# Patient Record
Sex: Male | Born: 1970 | Race: White | Hispanic: No | Marital: Married | Smoking: Never smoker
Health system: Southern US, Community
[De-identification: ages and names within clinical notes are randomized; demographics above are authoritative.]

---

## 2008-09-07 ENCOUNTER — Encounter: Admission: RE | Admit: 2008-09-07 | Discharge: 2008-09-07 | Payer: Self-pay | Admitting: Family Medicine

## 2017-10-08 ENCOUNTER — Encounter (INDEPENDENT_AMBULATORY_CARE_PROVIDER_SITE_OTHER): Payer: Self-pay | Admitting: Orthopaedic Surgery

## 2017-10-08 ENCOUNTER — Ambulatory Visit (INDEPENDENT_AMBULATORY_CARE_PROVIDER_SITE_OTHER): Payer: BC Managed Care – PPO

## 2017-10-08 ENCOUNTER — Ambulatory Visit (INDEPENDENT_AMBULATORY_CARE_PROVIDER_SITE_OTHER): Payer: BC Managed Care – PPO | Admitting: Orthopaedic Surgery

## 2017-10-08 VITALS — Ht 72.0 in | Wt 165.0 lb

## 2017-10-08 DIAGNOSIS — M25551 Pain in right hip: Secondary | ICD-10-CM

## 2017-10-08 NOTE — Progress Notes (Signed)
Office Visit Note   Patient: Kenneth Donovan           Date of Birth: [birth year]           MRN: [id number] Visit Date: 10/08/2017              Requested by: No referring provider defined for this encounter. PCP: Joycelyn Rua, MD   Assessment & Plan: Visit Diagnoses:  1. Pain in right hip     Plan: He is already tried activity modification and rest with taking 2-3 weeks off at a time from high impact aerobic activities with still becoming symptomatic when he gets back to high impact aerobic activities.  At this point given his normal plain film findings of like to set him up for an intra-articular steroid injection under live fluoroscopy by Dr. Alvester Morin.  This will be for his right hip.  The patient definitely wants to try this route.  All questions concerns were answered and addressed.  I will see him back in 4 weeks from now and he will already have had his injection we can see how this is helping him.  Of also recommended him trying glucosamine.  Follow-Up Instructions: Return in about 1 month (around 11/05/2017).   Orders:  Orders Placed This Encounter  Procedures  . XR HIP UNILAT W OR W/O PELVIS 2-3 VIEWS RIGHT   No orders of the defined types were placed in this encounter.     Procedures: No procedures performed   Clinical Data: No additional findings.   Subjective: Chief Complaint  Patient presents with  . Right Hip - Pain  Patient is a very pleasant 47 year old gentleman who comes in with chief complaint of right hip pain.  It hurts mainly in his groin and hurts mainly with high impact aerobic activities.  He is very athletic individual and also very thin.  He says if he is been on a run or perform rather high impact activities he then hurts in the groin area afterwards.  He also hurts on the extremes of rotation of his hip.  He denies any lateral hip pain.  He denies any pain with his left hip.  He denies any low back pain.  He denies any numbness and tingling in his  legs or feet.  This is been going on for a few months now.  It is slowly progressed and occasionally he gets a "catching sensation" in his hip.  He denies any specific injury that he is aware of.  He denies any urologic symptoms.  HPI  Review of Systems He currently denies any headache, chest pain, shortness of breath, fever, chills, nausea, vomiting.  Objective: Vital Signs: Ht 6' (1.829 m)   Wt 165 lb (74.8 kg)   BMI 22.38 kg/m   Physical Exam He is alert and oriented x3 and in no acute distress Ortho Exam Examination of his left hip is normal.  Examination of his right hip shows fluid and full range of motion but with pain at the extremes of rotation of the right hip. Specialty Comments:  No specialty comments available.  Imaging: Xr Hip Unilat W Or W/o Pelvis 2-3 Views Right  Result Date: 10/08/2017 An AP pelvis and lateral of the right hip show normal-appearing right hip with normal-appearing joint space in no acute findings.    PMFS History: There are no active problems to display for this patient.  History reviewed. No pertinent past medical history.  History reviewed. No pertinent family history.  History  reviewed. No pertinent surgical history. Social History   Occupational History  . Not on file  Tobacco Use  . Smoking status: Never Smoker  . Smokeless tobacco: Never Used  Substance and Sexual Activity  . Alcohol use: Yes    Comment: 5-7 drinks a week  . Drug use: Never  . Sexual activity: Not on file

## 2017-10-09 ENCOUNTER — Other Ambulatory Visit (INDEPENDENT_AMBULATORY_CARE_PROVIDER_SITE_OTHER): Payer: Self-pay

## 2017-10-09 DIAGNOSIS — M25551 Pain in right hip: Secondary | ICD-10-CM

## 2017-10-12 ENCOUNTER — Ambulatory Visit (INDEPENDENT_AMBULATORY_CARE_PROVIDER_SITE_OTHER): Payer: BC Managed Care – PPO | Admitting: Physical Medicine and Rehabilitation

## 2017-10-12 ENCOUNTER — Ambulatory Visit (INDEPENDENT_AMBULATORY_CARE_PROVIDER_SITE_OTHER): Payer: BC Managed Care – PPO

## 2017-10-12 ENCOUNTER — Encounter (INDEPENDENT_AMBULATORY_CARE_PROVIDER_SITE_OTHER): Payer: Self-pay | Admitting: Physical Medicine and Rehabilitation

## 2017-10-12 DIAGNOSIS — M25551 Pain in right hip: Secondary | ICD-10-CM

## 2017-10-12 NOTE — Progress Notes (Signed)
.  Numeric Pain Rating Scale and Functional Assessment Average Pain 4   In the last MONTH (on 0-10 scale) has pain interfered with the following?  1. General activity like being  able to carry out your everyday physical activities such as walking, climbing stairs, carrying groceries, or moving a chair?  Rating(1)   +Driver, -BT, -Dye Allergies.  

## 2017-10-12 NOTE — Patient Instructions (Signed)

## 2017-10-12 NOTE — Progress Notes (Signed)
   Kenneth FerrariRandy Donovan - 47 y.o. male MRN [id number]  Date of birth: 10/31/1970  Office Visit Note: Visit Date: 10/12/2017 PCP: Kenneth Donovan Referred by: Kenneth Donovan  Subjective: Chief Complaint  Patient presents with  . 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  He comes in today at the request of Dr. Magnus Donovan for intra-articular anesthetic hip arthrogram on the right.  He reports pain in the right hip and groin that started about 6 months ago.  He does not report much in the way of pain until after prolonged activity and then it will flareup.  He does have some sense of the pain most of the time.  His average pain is a 4 out of 10 but it can progress after activity.  Is not really limiting what he can do daily.   ROS Otherwise per HPI.  Assessment & Plan: Visit Diagnoses:  1. Pain in right hip     Plan: No additional findings.   Meds & Orders: No orders of the defined types were placed in this encounter.   Orders Placed This Encounter  Procedures  . Large Joint Inj: R hip joint  . XR C-ARM NO REPORT    Follow-up: Return for Dr. Magnus Donovan.   Procedures: Large Joint Inj: R hip joint on 10/12/2017 8:50 AM Indications: pain and diagnostic evaluation Details: 22 G needle, anterior approach  Arthrogram: Yes  Medications: 3 mL bupivacaine 0.5 %; 80 mg triamcinolone acetonide 40 MG/ML Outcome: tolerated well, no immediate complications  Arthrogram demonstrated excellent flow of contrast throughout the joint surface without extravasation or obvious defect.  The patient had some relief of symptoms during the anesthetic phase of the injection but will likely need to be active and see how the cortisone part of the injection seems to help.  He will follow-up with Dr. Magnus Donovan.  Procedure, treatment alternatives, risks and benefits explained, specific risks discussed. Consent was  given by the patient. Immediately prior to procedure a time out was called to verify the correct patient, procedure, equipment, support staff and site/side marked as required. Patient was prepped and draped in the usual sterile fashion.      No notes on file   Clinical History: No specialty comments available.   He reports that he has never smoked. He has never used smokeless tobacco. No results for input(s): HGBA1C, LABURIC in the last 8760 hours.  Objective:  VS:  HT:    WT:   BMI:     BP:   HR: bpm  TEMP: ( )  RESP:  Physical Exam  Ortho Exam Imaging: No results found.  Past Medical/Family/Surgical/Social History: Medications & Allergies reviewed per EMR, new medications updated. There are no active problems to display for this patient.  History reviewed. No pertinent past medical history. History reviewed. No pertinent family history. History reviewed. No pertinent surgical history. Social History   Occupational History  . Not on file  Tobacco Use  . Smoking status: Never Smoker  . Smokeless tobacco: Never Used  Substance and Sexual Activity  . Alcohol use: Yes    Comment: 5-7 drinks a week  . Drug use: Never  . Sexual activity: Not on file

## 2017-10-19 MED ORDER — TRIAMCINOLONE ACETONIDE 40 MG/ML IJ SUSP
80.0000 mg | INTRAMUSCULAR | Status: AC | PRN
  Administered 2017-10-12: 80 mg via INTRA_ARTICULAR

## 2017-10-19 MED ORDER — BUPIVACAINE HCL 0.5 % IJ SOLN
3.0000 mL | INTRAMUSCULAR | Status: AC | PRN
  Administered 2017-10-12: 3 mL via INTRA_ARTICULAR

## 2017-11-07 ENCOUNTER — Ambulatory Visit (INDEPENDENT_AMBULATORY_CARE_PROVIDER_SITE_OTHER): Payer: BC Managed Care – PPO | Admitting: Orthopaedic Surgery

## 2017-11-07 ENCOUNTER — Encounter (INDEPENDENT_AMBULATORY_CARE_PROVIDER_SITE_OTHER): Payer: Self-pay | Admitting: Orthopaedic Surgery

## 2017-11-07 DIAGNOSIS — M25551 Pain in right hip: Secondary | ICD-10-CM | POA: Diagnosis not present

## 2017-11-07 NOTE — Progress Notes (Signed)
The patient is following up for his right hip.  He is an avid athlete and exerciser and still getting locking catching in his right hip in the groin area.  He has pain on extremes of rotation as well.  We had obtained plain films of his pelvis and right hip at the last visit and they were normal-appearing in terms of anatomy and joint space.  I sent in for an intra-articular steroid injection and he said for 5 days he was pain-free.  He is gotten back to where now he is having pain in the groin again with locking and catching.  On exam his range of motion is entirely full of his right hip there is definitely pain on extremes of rotation.  At this point my concern is that he has a labral tear.  Given the symptoms that he is describing with a stabbing sharp feeling in his groin area and pain with extremes of rotation, there appears to be mechanical cause to his pain.  With that being said at this point an MRI arthrogram of the right hip is warranted to rule out a labral tear and to assess the cartilage in his hip.  All questions and concerns were answered and addressed.  We will work on getting that scheduled and see him back in follow-up to go over the MRI findings.

## 2017-11-08 ENCOUNTER — Other Ambulatory Visit (INDEPENDENT_AMBULATORY_CARE_PROVIDER_SITE_OTHER): Payer: Self-pay

## 2017-11-08 DIAGNOSIS — M25551 Pain in right hip: Secondary | ICD-10-CM

## 2017-11-21 ENCOUNTER — Ambulatory Visit (INDEPENDENT_AMBULATORY_CARE_PROVIDER_SITE_OTHER): Payer: BC Managed Care – PPO | Admitting: Orthopaedic Surgery

## 2017-11-26 ENCOUNTER — Ambulatory Visit
Admission: RE | Admit: 2017-11-26 | Discharge: 2017-11-26 | Disposition: A | Discharge status: Home / Self Care | Payer: BC Managed Care – PPO | Source: Ambulatory Visit | Attending: Orthopaedic Surgery | Admitting: Orthopaedic Surgery

## 2017-11-26 DIAGNOSIS — M25551 Pain in right hip: Secondary | ICD-10-CM

## 2017-11-26 MED ORDER — IOPAMIDOL (ISOVUE-M 200) INJECTION 41%
15.0000 mL | Freq: Once | INTRAMUSCULAR | Status: AC
  Administered 2017-11-26: 15 mL via INTRA_ARTICULAR

## 2017-11-28 ENCOUNTER — Ambulatory Visit (INDEPENDENT_AMBULATORY_CARE_PROVIDER_SITE_OTHER): Payer: BC Managed Care – PPO | Admitting: Orthopaedic Surgery

## 2017-11-28 ENCOUNTER — Encounter (INDEPENDENT_AMBULATORY_CARE_PROVIDER_SITE_OTHER): Payer: Self-pay | Admitting: Orthopaedic Surgery

## 2017-11-28 DIAGNOSIS — M25551 Pain in right hip: Secondary | ICD-10-CM

## 2017-11-28 MED ORDER — METHYLPREDNISOLONE 4 MG PO TABS: ORAL_TABLET | ORAL | 0 refills | Status: AC

## 2017-11-28 MED ORDER — DICLOFENAC SODIUM 1 % TD GEL: 2.0000 g | Freq: Four times a day (QID) | TRANSDERMAL | 3 refills | Status: AC

## 2017-11-28 NOTE — Progress Notes (Signed)
The patient is coming for follow-up to go over an MRI of his right hip.  He is a 47 year old avid runner and been having problems with pain around his right hip and we felt that he likely had a labral tear based on his clinical exam findings.  He still has pain that seems to be worsening around the right hip.  Pubis area.  The MRI did not show any cartilage changes in the hip at all and his labrum was normal.  It did show periosteal reaction and edema around the right pubis area suggesting some type of strain.  At this point we will try a steroid taper and send outpatient physical therapy as well as continued activity modification.  Over this will calm things down but if it does not he knows he can call us and we will pursue other treatment modalities or even a dedicated MRI of the pelvis and pubis area.  All questions concerns were answered and addressed.

## 2018-02-27 ENCOUNTER — Other Ambulatory Visit: Payer: Self-pay | Admitting: Surgery

## 2018-03-14 ENCOUNTER — Other Ambulatory Visit: Payer: Self-pay | Admitting: Surgery

## 2018-03-14 DIAGNOSIS — R1031 Right lower quadrant pain: Secondary | ICD-10-CM

## 2020-04-12 ENCOUNTER — Emergency Department (HOSPITAL_COMMUNITY)
Admission: EM | Admit: 2020-04-12 | Discharge: 2020-04-13 | Disposition: A | Discharge status: Home / Self Care | Payer: BC Managed Care – PPO | Attending: Emergency Medicine | Admitting: Emergency Medicine

## 2020-04-12 ENCOUNTER — Other Ambulatory Visit: Payer: Self-pay

## 2020-04-12 ENCOUNTER — Encounter (HOSPITAL_COMMUNITY): Payer: Self-pay

## 2020-04-12 ENCOUNTER — Emergency Department (HOSPITAL_COMMUNITY): Payer: BC Managed Care – PPO

## 2020-04-12 DIAGNOSIS — T63091A Toxic effect of venom of other snake, accidental (unintentional), initial encounter: Secondary | ICD-10-CM | POA: Insufficient documentation

## 2020-04-12 DIAGNOSIS — W5911XA Bitten by nonvenomous snake, initial encounter: Secondary | ICD-10-CM

## 2020-04-12 DIAGNOSIS — Z23 Encounter for immunization: Secondary | ICD-10-CM | POA: Insufficient documentation

## 2020-04-12 MED ORDER — TETANUS-DIPHTH-ACELL PERTUSSIS 5-2.5-18.5 LF-MCG/0.5 IM SUSP
0.5000 mL | Freq: Once | INTRAMUSCULAR | Status: AC
  Administered 2020-04-12: 0.5 mL via INTRAMUSCULAR
  Filled 2020-04-12: qty 0.5

## 2020-04-12 NOTE — ED Notes (Signed)
Foot - 26cm Ankle - 22cm Calf - 37cm  Thigh - 46cm  No groin tenderness, no change in appearance since presentation.

## 2020-04-12 NOTE — ED Provider Notes (Signed)
[facility] EMERGENCY DEPARTMENT Provider Note  CSN: [id number] Arrival date & time: 04/12/20 1936    History Chief Complaint  Patient presents with  . Snake Bite    HPI  Kenneth Donovan is a 49 y.o. male who reports he was walking his dog earlier this evening when he was bitten by a snake, reported to be a copperhead, on the R heel. Reports moderate aching pain. No significant swelling as of my evaluation. Bite occurred around 1900hrs    History reviewed. No pertinent past medical history.  History reviewed. No pertinent surgical history.  No family history on file.  Social History   Tobacco Use  . Smoking status: Never Smoker  . Smokeless tobacco: Never Used  Substance Use Topics  . Alcohol use: Yes    Comment: 5-7 drinks a week  . Drug use: Never     Home Medications Prior to Admission medications   Medication Sig Start Date End Date Taking? Authorizing Provider  diclofenac sodium (VOLTAREN) 1 % GEL Apply 2 g topically 4 (four) times daily. 11/28/17   Kathryne Hitch, MD  fluticasone (FLONASE) 50 MCG/ACT nasal spray Place into both nostrils daily.    [provider]  loratadine-pseudoephedrine (CLARITIN-D 12-HOUR) 5-120 MG tablet Take 1 tablet by mouth 2 (two) times daily.    [provider]  methylPREDNISolone (MEDROL) 4 MG tablet Medrol dose pack. Take as instructed 11/28/17   Kathryne Hitch, MD     Allergies    Patient has no known allergies.   Review of Systems   Review of Systems A comprehensive review of systems was completed and negative except as noted in HPI.    Physical Exam BP (!) 184/74 (BP Location: Left Arm)   Pulse 80   Temp 98.3 F (36.8 C) (Oral)   Resp 16   Ht 6' (1.829 m)   Wt 64.4 kg   SpO2 99%   BMI 19.26 kg/m   Physical Exam Vitals and nursing note reviewed.  HENT:     Head: Normocephalic.     Nose: Nose normal.  Eyes:     Extraocular Movements: Extraocular movements intact.    Pulmonary:     Effort: Pulmonary effort is normal.  Musculoskeletal:        General: Normal range of motion.     Cervical back: Neck supple.     Comments: Small bite mark to R lateral heel, mild-moderate tenderness, no significant soft tissue swelling proximal to bite.   Skin:    Findings: No rash (on exposed skin).  Neurological:     Mental Status: He is alert and oriented to person, place, and time.  Psychiatric:        Mood and Affect: Mood normal.      ED Results / Procedures / Treatments   Labs (all labs ordered are listed, but only abnormal results are displayed) Labs Reviewed - No data to display  EKG None  Radiology DG Foot Complete Right  Result Date: 04/12/2020 CLINICAL DATA:  Snake bite EXAM: RIGHT FOOT COMPLETE - 3+ VIEW COMPARISON:  None. FINDINGS: There is no evidence of fracture or dislocation. There is no evidence of arthropathy or other focal bone abnormality. Soft tissues are unremarkable. IMPRESSION: Negative. Electronically Signed   By: Jasmine Pang M.D.   On: 04/12/2020 21:17    Procedures Procedures  Medications Ordered in the ED Medications  Tdap (BOOSTRIX) injection 0.5 mL (0.5 mLs Intramuscular Given 04/12/20 2256)     MDM Rules/Calculators/A&P  MDM Will follow poison control recommendations for observation and periodic measurements of swelling. Check xray for FB and update TDAP  ED Course  I have reviewed the triage vital signs and the nursing notes.  Pertinent labs & imaging results that were available during my care of the patient were reviewed by me and considered in my medical decision making (see chart for details).  Clinical Course as of Apr 12 2314  Mon Apr 12, 2020  2311 Patient remains well appearing. No further swelling, now 5 hours post bite. He has some R groin discomfort but no swelling or bruising. Poison control does not recommend Cofab. Poison control typically recommends an 8 hour observation period, but with no change at  this point he would like to go home. Advised to return for any worsening pain, swelling bruising, SOB, GI symptoms, fever or any other concerns.    [CS]    Clinical Course User Index [CS] Pollyann Savoy, MD    Final Clinical Impression(s) / ED Diagnoses Final diagnoses:  Snake bite, initial encounter    Rx / DC Orders ED Discharge Orders    None       Pollyann Savoy, MD 04/12/20 2315

## 2020-04-12 NOTE — ED Triage Notes (Addendum)
Pt reports being bit by a medium sized copperhead at approx 1900 on right heel. No obvious swelling or bruising. Denies, ice, or tourniquet.   Poison control contacted. Recommend observation for 8 hours. They state only coagulations if swelling develops. Spoke with Revonda Standard, RN/

## 2020-04-12 NOTE — ED Notes (Signed)
Poison control contacted confirms that groin tenderness is normal. Spoke with Verlon Au, Charity fundraiser.

## 2020-04-12 NOTE — ED Notes (Signed)
Foot- 26cm  Ankle- 22cm  Calf- 36cm Thigh- 45cm   States new groin tenderness

## 2021-03-26 ENCOUNTER — Emergency Department (HOSPITAL_BASED_OUTPATIENT_CLINIC_OR_DEPARTMENT_OTHER)
Admission: EM | Admit: 2021-03-26 | Discharge: 2021-03-26 | Disposition: A | Discharge status: Home / Self Care | Payer: BC Managed Care – PPO | Attending: Emergency Medicine | Admitting: Emergency Medicine

## 2021-03-26 ENCOUNTER — Emergency Department (HOSPITAL_BASED_OUTPATIENT_CLINIC_OR_DEPARTMENT_OTHER): Payer: BC Managed Care – PPO | Admitting: Radiology

## 2021-03-26 ENCOUNTER — Encounter (HOSPITAL_BASED_OUTPATIENT_CLINIC_OR_DEPARTMENT_OTHER): Payer: Self-pay | Admitting: Emergency Medicine

## 2021-03-26 ENCOUNTER — Other Ambulatory Visit: Payer: Self-pay

## 2021-03-26 DIAGNOSIS — R079 Chest pain, unspecified: Secondary | ICD-10-CM | POA: Diagnosis present

## 2021-03-26 DIAGNOSIS — D72819 Decreased white blood cell count, unspecified: Secondary | ICD-10-CM | POA: Diagnosis not present

## 2021-03-26 LAB — BASIC METABOLIC PANEL
Anion gap: 9 (ref 5–15)
BUN: 20 mg/dL (ref 6–20)
CO2: 26 mmol/L (ref 22–32)
Calcium: 9.5 mg/dL (ref 8.9–10.3)
Chloride: 104 mmol/L (ref 98–111)
Creatinine, Ser: 1.12 mg/dL (ref 0.61–1.24)
GFR, Estimated: >60 mL/min (ref >60)
Glucose, Bld: 151 mg/dL — ABNORMAL HIGH (ref 70–99)
Potassium: 3.6 mmol/L (ref 3.5–5.1)
Sodium: 139 mmol/L (ref 135–145)

## 2021-03-26 LAB — TROPONIN I (HIGH SENSITIVITY)
Troponin I (High Sensitivity): 4 ng/L (ref <18)
Troponin I (High Sensitivity): 4 ng/L (ref <18)

## 2021-03-26 LAB — CBC
HCT: 40.9 % (ref 39.0–52.0)
Hemoglobin: 14.1 g/dL (ref 13.0–17.0)
MCH: 30.9 pg (ref 26.0–34.0)
MCHC: 34.5 g/dL (ref 30.0–36.0)
MCV: 89.5 fL (ref 80.0–100.0)
Platelets: 198 10*3/uL (ref 150–400)
RBC: 4.57 MIL/uL (ref 4.22–5.81)
RDW: 12.4 % (ref 11.5–15.5)
WBC: 3.6 10*3/uL — ABNORMAL LOW (ref 4.0–10.5)
nRBC: 0 % (ref 0.0–0.2)

## 2021-03-26 MED ORDER — ASPIRIN 81 MG PO CHEW
324.0000 mg | CHEWABLE_TABLET | Freq: Once | ORAL | Status: AC
  Administered 2021-03-26: 324 mg via ORAL
  Filled 2021-03-26: qty 4

## 2021-03-26 NOTE — Discharge Instructions (Addendum)
Your workup was very reassuring today. Please follow up with your PCP regarding ED visit. Call them Monday to schedule an appointment.   I would recommend taking it easy over the next couple of days and to cease exercise should your chest pain worsen with same.   Return to the ED IMMEDIATELY for any new/worsening symptoms including worsening pain, shortness of breath,  nausea/vomiting, excessive sweating/clamminess, or any other new/concerning symptoms.

## 2021-03-26 NOTE — ED Triage Notes (Signed)
Chest pressure/pain mid chest ,upper back, and left arm. Started 2 hours ago. Denies n/v.

## 2021-03-26 NOTE — ED Provider Notes (Signed)
[facility] EMERGENCY DEPT Provider Note   CSN: [id number] Arrival date & time: 03/26/21  1301     History Chief Complaint  Patient presents with   Chest Pain    Kenneth Donovan is a 50 y.o. male who presents to the ED today with complaint of gradual onset, constant, pressure like, left sided chest pain that began around 10 AM this morning. Pt reports that he was doing regular housework when the pain came on.  He states the pain started in his left upper back and has since felt like it is going through to his left chest.  He denies any ripping/tearing sensation.  Denies any shortness of breath, nausea, vomiting, diaphoresis with same.  He states that he at first thought he was having some muscular type pain and so he took Advil without much relief.  It subsequently traveled to his chest causing some concern.  He does mention it 2 to 3 days ago he woke up in the middle of the night with a cramping sensation to his substernal area that lasted about 20 to 30 seconds.  He did not think much of it until today.  He does mention that he ran 5 miles earlier today and typically runs about 30 miles per week without any issues with chest pain.  No family history of CAD.  Patient is a non-smoker.  He denies any history of DVT/PE.  No recent prolonged travel or immobilization.  No hemoptysis.  No active malignancy.  No exogenous hormone use.   The history is provided by the patient and medical records.      History reviewed. No pertinent past medical history.  Patient Active Problem List   Diagnosis Date Noted   Pain in right hip 11/07/2017    History reviewed. No pertinent surgical history.     No family history on file.  Social History   Tobacco Use   Smoking status: Never   Smokeless tobacco: Never  Substance Use Topics   Alcohol use: Yes    Comment: 5-7 drinks a week   Drug use: Never    Home Medications Prior to Admission medications   Medication Sig Start Date End  Date Taking? Authorizing Provider  diclofenac sodium (VOLTAREN) 1 % GEL Apply 2 g topically 4 (four) times daily. 11/28/17   Kathryne Hitch, MD  fluticasone (FLONASE) 50 MCG/ACT nasal spray Place into both nostrils daily.    [provider]  loratadine-pseudoephedrine (CLARITIN-D 12-HOUR) 5-120 MG tablet Take 1 tablet by mouth 2 (two) times daily.    [provider]  methylPREDNISolone (MEDROL) 4 MG tablet Medrol dose pack. Take as instructed 11/28/17   Kathryne Hitch, MD    Allergies    Patient has no known allergies.  Review of Systems   Review of Systems  Constitutional:  Negative for chills, diaphoresis, fatigue and fever.  Respiratory:  Negative for shortness of breath.   Cardiovascular:  Positive for chest pain. Negative for palpitations and leg swelling.  Gastrointestinal:  Negative for nausea and vomiting.  Musculoskeletal:  Positive for back pain.  All other systems reviewed and are negative.  Physical Exam Updated Vital Signs BP 114/81   Pulse 60   Temp 98.4 F (36.9 C) (Oral)   Resp 17   SpO2 99%   Physical Exam Vitals and nursing note reviewed.  Constitutional:      Appearance: He is not ill-appearing or diaphoretic.  HENT:     Head: Normocephalic and atraumatic.  Eyes:  Conjunctiva/sclera: Conjunctivae normal.  Cardiovascular:     Rate and Rhythm: Normal rate and regular rhythm.     Pulses:          Radial pulses are 2+ on the right side and 2+ on the left side.       Posterior tibial pulses are 2+ on the right side and 2+ on the left side.     Heart sounds: Normal heart sounds.  Pulmonary:     Effort: Pulmonary effort is normal.     Breath sounds: Normal breath sounds. No decreased breath sounds, wheezing, rhonchi or rales.  Abdominal:     Palpations: Abdomen is soft.     Tenderness: There is no abdominal tenderness.  Musculoskeletal:     Cervical back: Neck supple.     Right lower leg: No tenderness. No edema.      Left lower leg: No tenderness. No edema.  Skin:    General: Skin is warm and dry.  Neurological:     Mental Status: He is alert.    ED Results / Procedures / Treatments   Labs (all labs ordered are listed, but only abnormal results are displayed) Labs Reviewed  BASIC METABOLIC PANEL - Abnormal; Notable for the following components:      Result Value   Glucose, Bld 151 (*)    All other components within normal limits  CBC - Abnormal; Notable for the following components:   WBC 3.6 (*)    All other components within normal limits  TROPONIN I (HIGH SENSITIVITY)  TROPONIN I (HIGH SENSITIVITY)    EKG EKG Interpretation  Date/Time:  Saturday March 26 2021 13:13:12 EDT Ventricular Rate:  64 PR Interval:  178 QRS Duration: 100 QT Interval:  420 QTC Calculation: 433 R Axis:   65 Text Interpretation: Normal sinus rhythm Normal ECG Confirmed by Cathren Laine ([id number]) on 03/26/2021 1:16:17 PM  Radiology DG Chest 1 View  Result Date: 03/26/2021 CLINICAL DATA:  Chest pain. EXAM: CHEST  1 VIEW COMPARISON:  None. FINDINGS: The heart size and mediastinal contours are within normal limits. Both lungs are clear. The visualized skeletal structures are unremarkable. IMPRESSION: No active disease. Electronically Signed   By: Ted Mcalpine M.D.   On: 03/26/2021 14:39    Procedures Procedures   Medications Ordered in ED Medications  aspirin chewable tablet 324 mg (324 mg Oral Given 03/26/21 1405)    ED Course  I have reviewed the triage vital signs and the nursing notes.  Pertinent labs & imaging results that were available during my care of the patient were reviewed by me and considered in my medical decision making (see chart for details).    MDM Rules/Calculators/A&P                           50 year old male who presents to the ED today with complaint of left upper back/left-sided chest pain began around 10 AM this morning without any other associated symptoms.  Had  similar cramping sensation in his chest 2 to 3 days ago that lasted a couple of seconds.  Patient is an otherwise very healthy male who typically runs 30 miles per week without any active chest pain.  He ran 5 miles earlier today without difficulty.  On arrival to the ED vitals are stable and patient appears to be in no acute distress.  His blood pressure is slightly elevated at 147/91 without history of same.  Patient does not take any  blood pressure medication or medication whatsoever for that matter.  An EKG was obtained when patient arrived, normal sinus rhythm without any acute ischemic changes.  Will work-up for ACS at this time.  Will provide Advil for chest pain.  Given his overall comfortable appearance, as well as equal pulses I have very low suspicion for dissection.  He is also without any risk factors for PE.  Given he is nontachycardic, nonhypoxic and his story does not sound typical of PE I do not feel he requires a D-dimer at this time.   CXR clear CBC with leukopenia 3.6; do not have baseline to compare to. Hgb stable at 14.1 BMP with glucose 151; no other electrolyte abnormalities. No hx diabetes. Does not qualify for diabetic range.  Troponin of 4. Will plan to repeat. If negative will discharge home with PCP follow up.   Repeat troponin of 4. Pt reports pain still very mildly there. Have recommended following up with PCP. Strict return precautions discussed. Pt in agreement with plan and stable for discharge.   This note was prepared using Dragon voice recognition software and may include unintentional dictation errors due to the inherent limitations of voice recognition software.   Final Clinical Impression(s) / ED Diagnoses Final diagnoses:  Nonspecific chest pain    Rx / DC Orders ED Discharge Orders     None        Discharge Instructions      Your workup was very reassuring today. Please follow up with your PCP regarding ED visit. Call them Monday to schedule an  appointment.   I would recommend taking it easy over the next couple of days and to cease exercise should your chest pain worsen with same.   Return to the ED IMMEDIATELY for any new/worsening symptoms including worsening pain, shortness of breath,  nausea/vomiting, excessive sweating/clamminess, or any other new/concerning symptoms.        Tanda Rockers, PA-C 03/26/21 1641    Cathren Laine, MD 03/27/21 1105

## 2021-03-26 NOTE — ED Notes (Signed)
Ekg given to Dr. Denton Lank upon immediate result.

## 2022-03-20 IMAGING — CR DG FOOT COMPLETE 3+V*R*
3 series · 3 of 3 positions shown · non-contrast
Comparison: None.

CLINICAL DATA: Snake bite

EXAM:
RIGHT FOOT COMPLETE - 3+ VIEW

[x foot ap right]
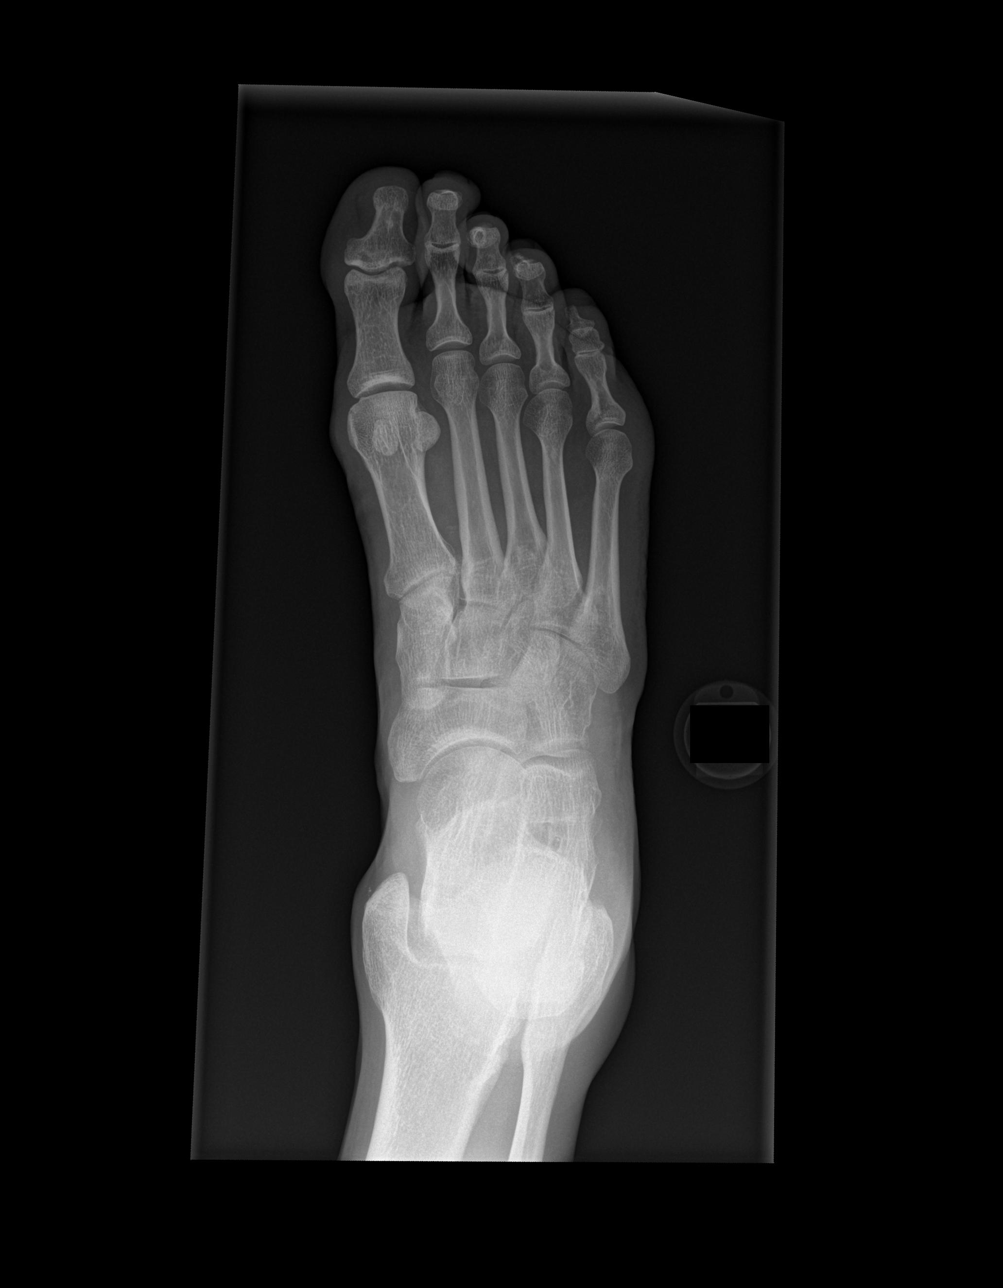

[x foot obl right]
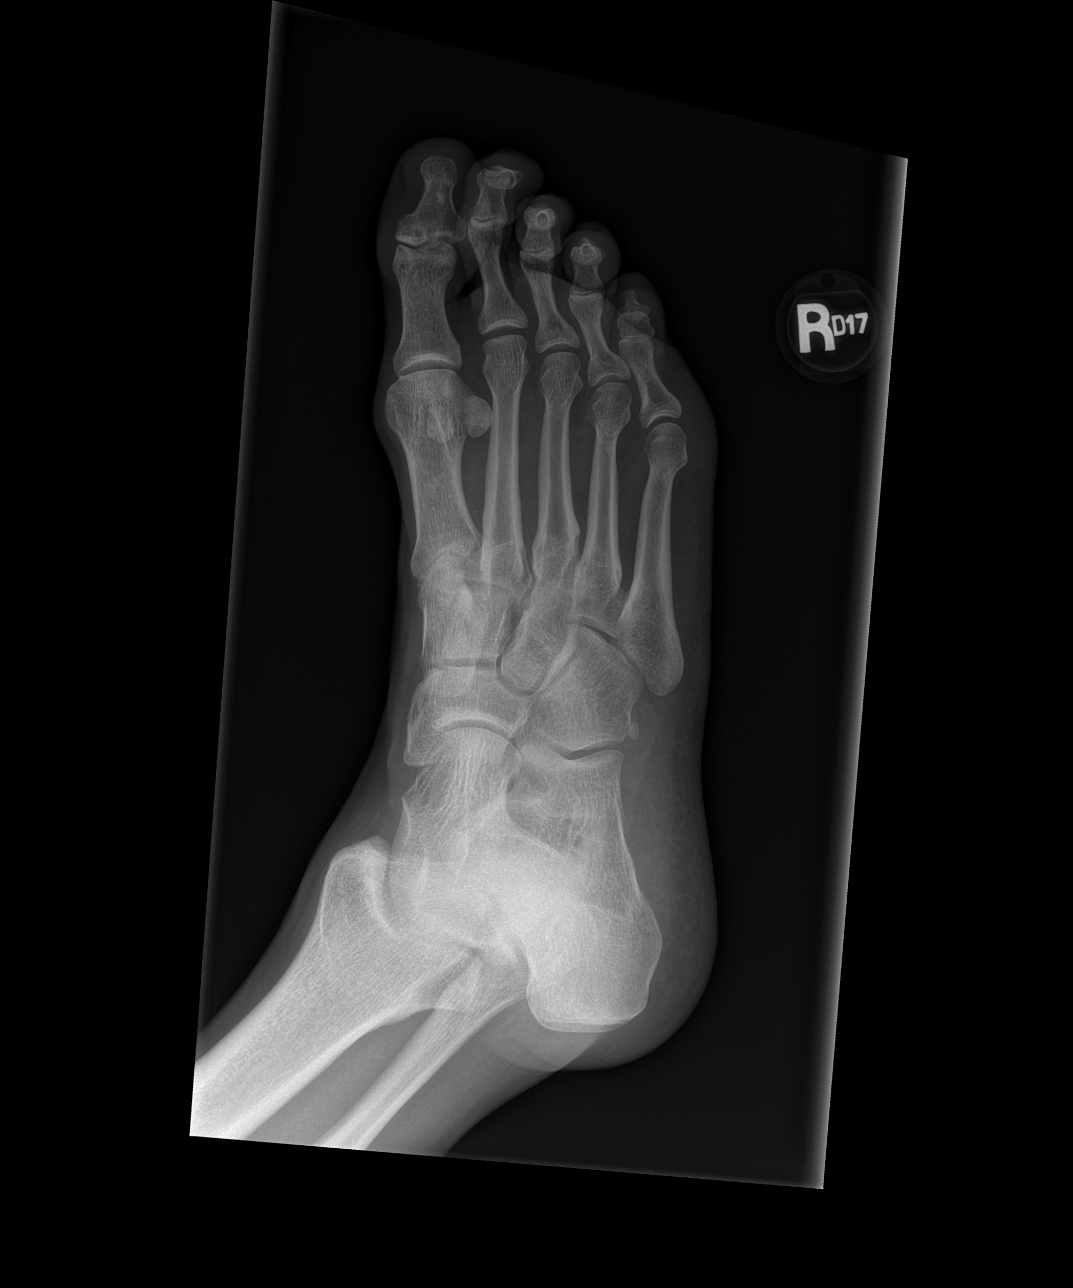

[x foot lat right]
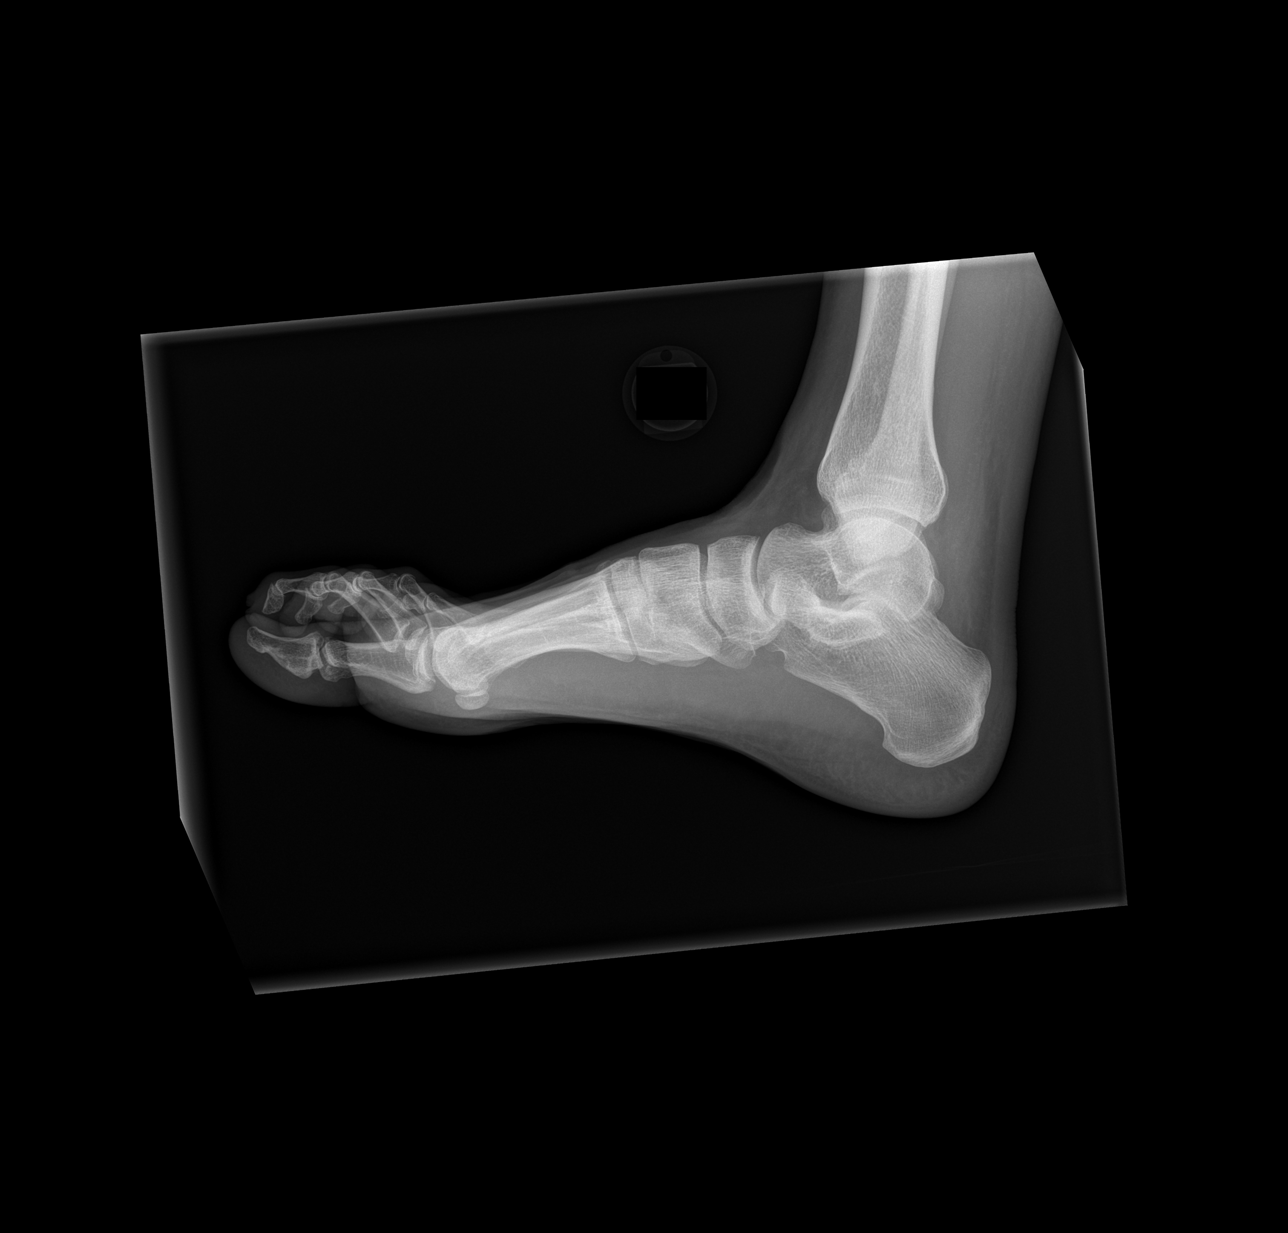

[3 of 3 positions shown; findings below may reference images not displayed]

FINDINGS: There is no evidence of fracture or dislocation. There is no
evidence of arthropathy or other focal bone abnormality. Soft
tissues are unremarkable.
IMPRESSION: Negative.

## 2024-07-03 ENCOUNTER — Encounter (HOSPITAL_COMMUNITY): Payer: Self-pay

## 2024-07-03 ENCOUNTER — Ambulatory Visit (HOSPITAL_COMMUNITY): Admission: EM | Admit: 2024-07-03 | Discharge: 2024-07-03 | Disposition: A | Discharge status: Home / Self Care

## 2024-07-03 DIAGNOSIS — R300 Dysuria: Secondary | ICD-10-CM | POA: Diagnosis present

## 2024-07-03 DIAGNOSIS — Z113 Encounter for screening for infections with a predominantly sexual mode of transmission: Secondary | ICD-10-CM | POA: Diagnosis present

## 2024-07-03 DIAGNOSIS — R369 Urethral discharge, unspecified: Secondary | ICD-10-CM | POA: Diagnosis present

## 2024-07-03 LAB — POCT URINE DIPSTICK
Bilirubin, UA: NEGATIVE
Glucose, UA: NEGATIVE mg/dL
Ketones, POC UA: NEGATIVE mg/dL
Nitrite, UA: NEGATIVE
POC PROTEIN,UA: NEGATIVE
Spec Grav, UA: 1.015 (ref 1.010–1.025)
Urobilinogen, UA: 0.2 U/dL
pH, UA: 7 (ref 5.0–8.0)

## 2024-07-03 MED ORDER — NITROFURANTOIN MONOHYD MACRO 100 MG PO CAPS: 100.0000 mg | ORAL_CAPSULE | Freq: Two times a day (BID) | ORAL | 0 refills | Status: AC

## 2024-07-03 NOTE — Discharge Instructions (Addendum)
 Your urine shows you likely have a urinary tract infection.   I have sent your urine for culture to confirm this.   You have been prescribed nitrofurantoin .  Take 1 capsule 2 times a day for 7 days.  We will call you if we need to change your antibiotic when we find out the type of bacteria growing in your bladder.  Take antibiotic as directed with a snack/food to avoid stomach upset. To avoid GI upset please take this medication with food.   Avoid drinking beverages that irritate the urinary tract like sodas, tea, coffee, or juice. Drink plenty of water to stay well hydrated and prevent severe infection.  Additionally your cytology swab has been sent out.  Results typically take 2-3 days to result. You will be notified for any positive results. Please abstain from intercourse until you have your results.  If you develop pain in your upper back on one side, fever despite taking antibiotic, nausea and vomiting where you cannot keep anything down for 24 hours, dizziness/severe headache, or decreased urinary output, please go to the ER. Follow-up with PCP.

## 2024-07-03 NOTE — ED Provider Notes (Signed)
 [facility]    CSN: [id number] Arrival date & time: 07/03/24  1724      History   Chief Complaint Chief Complaint  Patient presents with   Dysuria    HPI Kenneth Donovan is a 53 y.o. male.   This 53 year old male is being seen for acute onset of dysuria, urgency, frequency and puslike drainage at his urethral meatus onset last night.  He denies concern for STI.  He denies headache, blurry vision, dizziness.  He denies chest pain, shortness of breath.  He denies abdominal pain, nausea, vomiting, diarrhea.  He denies penile pain, testicular swelling.   Dysuria Presenting symptoms: dysuria and penile discharge   Presenting symptoms: no penile pain   Associated symptoms: urinary frequency   Associated symptoms: no abdominal pain, no diarrhea, no fever, no nausea, no penile swelling, no scrotal swelling and no vomiting     Past Medical History:  Diagnosis Date   Arthritis     Patient Active Problem List   Diagnosis Date Noted   Pain in right hip 11/07/2017    History reviewed. No pertinent surgical history.     Home Medications    Prior to Admission medications  Medication Sig Start Date End Date Taking? Authorizing Provider  meloxicam (MOBIC) 15 MG tablet Take 15 mg by mouth daily. 05/27/24  Yes [provider]  diclofenac  sodium (VOLTAREN ) 1 % GEL Apply 2 g topically 4 (four) times daily. 11/28/17   Vernetta Lonni GRADE, MD  fluticasone (FLONASE) 50 MCG/ACT nasal spray Place into both nostrils daily.    [provider]  loratadine-pseudoephedrine (CLARITIN-D 12-HOUR) 5-120 MG tablet Take 1 tablet by mouth 2 (two) times daily.    [provider]  methylPREDNISolone  (MEDROL ) 4 MG tablet Medrol  dose pack. Take as instructed 11/28/17   Vernetta Lonni GRADE, MD    Family History History reviewed. No pertinent family history.  Social History Social History[1]   Allergies   Patient has no known allergies.   Review of  Systems Review of Systems  Constitutional:  Negative for chills and fever.  Eyes:  Negative for visual disturbance.  Respiratory:  Negative for shortness of breath.   Cardiovascular:  Negative for chest pain.  Gastrointestinal:  Negative for abdominal pain, diarrhea, nausea and vomiting.  Genitourinary:  Positive for dysuria, frequency, penile discharge and urgency. Negative for difficulty urinating, genital sores, penile pain, penile swelling, scrotal swelling and testicular pain.  Musculoskeletal:  Negative for arthralgias and myalgias.  Skin:  Negative for color change and rash.  Neurological:  Negative for dizziness and headaches.  All other systems reviewed and are negative.    Physical Exam Triage Vital Signs ED Triage Vitals [07/03/24 1849]  Encounter Vitals Group     BP      Girls Systolic BP Percentile      Girls Diastolic BP Percentile      Boys Systolic BP Percentile      Boys Diastolic BP Percentile      Pulse      Resp      Temp      Temp src      SpO2      Weight      Height      Head Circumference      Peak Flow      Pain Score 3     Pain Loc      Pain Education      Exclude from Growth Chart    No data  found.  Updated Vital Signs BP 138/83 (BP Location: Left Arm)   Pulse 69   Temp 98.3 F (36.8 C) (Oral)   Resp 17   Ht 6' (1.829 m)   Wt 165 lb (74.8 kg)   SpO2 96%   BMI 22.38 kg/m   Visual Acuity Right Eye Distance:   Left Eye Distance:   Bilateral Distance:    Right Eye Near:   Left Eye Near:    Bilateral Near:     Physical Exam Vitals and nursing note reviewed.  Constitutional:      General: He is awake. He is not in acute distress.    Appearance: Normal appearance. He is well-developed and normal weight.     Comments: Pleasant male appearing stated age found sitting in chair in no acute distress.  HENT:     Head: Normocephalic and atraumatic.  Eyes:     Conjunctiva/sclera: Conjunctivae normal.  Cardiovascular:     Rate and  Rhythm: Normal rate and regular rhythm.     Heart sounds: Normal heart sounds. No murmur heard. Pulmonary:     Effort: Pulmonary effort is normal. No respiratory distress.     Breath sounds: Normal breath sounds.  Abdominal:     General: Bowel sounds are normal.     Palpations: Abdomen is soft.     Tenderness: There is no abdominal tenderness.  Skin:    General: Skin is warm and dry.     Capillary Refill: Capillary refill takes less than 2 seconds.  Neurological:     Mental Status: He is alert.  Psychiatric:        Mood and Affect: Mood normal.        Behavior: Behavior is cooperative.      UC Treatments / Results  Labs (all labs ordered are listed, but only abnormal results are displayed) Labs Reviewed  POCT URINE DIPSTICK - Abnormal; Notable for the following components:      Result Value   Clarity, UA cloudy (*)    Blood, UA trace-lysed (*)    Leukocytes, UA Small (1+) (*)    All other components within normal limits  CYTOLOGY, (ORAL, ANAL, URETHRAL) ANCILLARY ONLY    EKG   Radiology No results found.  Procedures Procedures (including critical care time)  Medications Ordered in UC Medications - No data to display  Initial Impression / Assessment and Plan / UC Course  I have reviewed the triage vital signs and the nursing notes.  Pertinent labs & imaging results that were available during my care of the patient were reviewed by me and considered in my medical decision making (see chart for details).     Vitals and triage reviewed, patient is hemodynamically stable.  UA significant for cloudy urine, trace RBC, small leukocytes.  Given symptoms of dysuria, urgency, frequency patient is prescribed nitrofurantoin .  Culture will be sent.  Patient will be notified if change in antibiotic is needed.  Patient denies concern for STI, cytology swab obtained due to reports of puslike penile discharge.  Staff will contact if results are positive to initiate appropriate  treatment.  Plan of care, follow-up care, return precautions given, no questions at this time. Final Clinical Impressions(s) / UC Diagnoses   Final diagnoses:  Dysuria   Discharge Instructions   None    ED Prescriptions   None    PDMP not reviewed this encounter.    [1]  Social History Tobacco Use   Smoking status: Never   Smokeless tobacco:  Never  Vaping Use   Vaping status: Never Used  Substance Use Topics   Alcohol use: Yes    Comment: 5-7 drinks a week   Drug use: Never     Lennice Jon BROCKS, FNP 07/03/24 1948

## 2024-07-03 NOTE — ED Triage Notes (Signed)
 Patient presenting with burning urination, urinary frequency, and pus like discharge (this morning). Symptoms in total onset last night.   Denies any history of kidney problems. Denies any concerns for STIs.

## 2024-07-04 LAB — CYTOLOGY, (ORAL, ANAL, URETHRAL) ANCILLARY ONLY
Chlamydia: NEGATIVE
Comment: NEGATIVE
Comment: NEGATIVE
Comment: NORMAL
Neisseria Gonorrhea: NEGATIVE
Trichomonas: NEGATIVE
